# Patient Record
Sex: Male | Born: 1950 | Race: White | Hispanic: No | Marital: Married | Smoking: Former smoker
Health system: Southern US, Community
[De-identification: ages and names within clinical notes are randomized; demographics above are authoritative.]

## PROBLEM LIST (undated history)

## (undated) DIAGNOSIS — K529 Noninfective gastroenteritis and colitis, unspecified: Secondary | ICD-10-CM

## (undated) HISTORY — PX: FINGER SURGERY: SHX640

## (undated) HISTORY — PX: HERNIA REPAIR: SHX51

## (undated) HISTORY — DX: Noninfective gastroenteritis and colitis, unspecified: K52.9

## (undated) HISTORY — PX: APPENDECTOMY: SHX54

---

## 2008-02-06 ENCOUNTER — Ambulatory Visit: Payer: Self-pay | Admitting: Internal Medicine

## 2009-02-04 ENCOUNTER — Ambulatory Visit: Payer: Self-pay | Admitting: Gastroenterology

## 2014-06-16 ENCOUNTER — Ambulatory Visit
Admit: 2014-06-16 | Disposition: A | Discharge status: Home / Self Care | Payer: Self-pay | Attending: Family Medicine | Admitting: Family Medicine

## 2017-02-26 DIAGNOSIS — K200 Eosinophilic esophagitis: Secondary | ICD-10-CM

## 2017-02-26 HISTORY — DX: Eosinophilic esophagitis: K20.0

## 2017-03-04 ENCOUNTER — Other Ambulatory Visit: Payer: Self-pay

## 2017-03-04 ENCOUNTER — Ambulatory Visit
Admission: EM | Admit: 2017-03-04 | Discharge: 2017-03-04 | Disposition: A | Discharge status: Home / Self Care | Payer: Medicare Other | Attending: Family Medicine | Admitting: Family Medicine

## 2017-03-04 ENCOUNTER — Encounter: Payer: Self-pay | Admitting: Emergency Medicine

## 2017-03-04 DIAGNOSIS — Z87891 Personal history of nicotine dependence: Secondary | ICD-10-CM | POA: Insufficient documentation

## 2017-03-04 DIAGNOSIS — N529 Male erectile dysfunction, unspecified: Secondary | ICD-10-CM | POA: Diagnosis not present

## 2017-03-04 DIAGNOSIS — N410 Acute prostatitis: Secondary | ICD-10-CM

## 2017-03-04 DIAGNOSIS — R300 Dysuria: Secondary | ICD-10-CM

## 2017-03-04 DIAGNOSIS — R350 Frequency of micturition: Secondary | ICD-10-CM | POA: Diagnosis present

## 2017-03-04 LAB — CBC WITH DIFFERENTIAL/PLATELET
BASOS ABS: 0.1 10*3/uL (ref 0–0.1)
BASOS PCT: 1 %
EOS ABS: 0.2 10*3/uL (ref 0–0.7)
Eosinophils Relative: 3 %
HEMATOCRIT: 42.5 % (ref 40.0–52.0)
Hemoglobin: 14.9 g/dL (ref 13.0–18.0)
Lymphocytes Relative: 27 %
Lymphs Abs: 2.4 10*3/uL (ref 1.0–3.6)
MCH: 31.3 pg (ref 26.0–34.0)
MCHC: 35 g/dL (ref 32.0–36.0)
MCV: 89.6 fL (ref 80.0–100.0)
MONO ABS: 0.9 10*3/uL (ref 0.2–1.0)
Monocytes Relative: 10 %
NEUTROS ABS: 5.3 10*3/uL (ref 1.4–6.5)
Neutrophils Relative %: 59 %
PLATELETS: 243 10*3/uL (ref 150–440)
RBC: 4.74 MIL/uL (ref 4.40–5.90)
RDW: 13.1 % (ref 11.5–14.5)
WBC: 8.9 10*3/uL (ref 3.8–10.6)

## 2017-03-04 LAB — URINALYSIS, COMPLETE (UACMP) WITH MICROSCOPIC
Bilirubin Urine: NEGATIVE
GLUCOSE, UA: NEGATIVE mg/dL
KETONES UR: NEGATIVE mg/dL
Nitrite: NEGATIVE
PROTEIN: 100 mg/dL — AB
SQUAMOUS EPITHELIAL / LPF: NONE SEEN
Specific Gravity, Urine: 1.025 (ref 1.005–1.030)
pH: 6 (ref 5.0–8.0)

## 2017-03-04 LAB — BASIC METABOLIC PANEL
ANION GAP: 8 (ref 5–15)
BUN: 11 mg/dL (ref 6–20)
CALCIUM: 9.1 mg/dL (ref 8.9–10.3)
CO2: 30 mmol/L (ref 22–32)
Chloride: 99 mmol/L — ABNORMAL LOW (ref 101–111)
Creatinine, Ser: 0.88 mg/dL (ref 0.61–1.24)
Glucose, Bld: 113 mg/dL — ABNORMAL HIGH (ref 65–99)
Potassium: 4.7 mmol/L (ref 3.5–5.1)
Sodium: 137 mmol/L (ref 135–145)

## 2017-03-04 MED ORDER — SULFAMETHOXAZOLE-TRIMETHOPRIM 800-160 MG PO TABS: 1.0000 | ORAL_TABLET | Freq: Two times a day (BID) | ORAL | 0 refills | Status: AC

## 2017-03-04 NOTE — Discharge Instructions (Signed)
Take medication as prescribed. Rest. Drink plenty of fluids.  ° °Follow up with your primary care physician this week. Return to Urgent care for new or worsening concerns.  ° °

## 2017-03-04 NOTE — ED Provider Notes (Signed)
[facility] ____________________________________________  Time seen: Approximately 12:27 PM  I have reviewed the triage vital signs and the nursing notes.   HISTORY  Chief Complaint Dysuria and Urinary Frequency   HPI Alan Meyer is a 67 y.o. male presenting for evaluation of dysuria as well as recent fever.  Patient reports this past Friday and Saturday he subjectively had a fever with chills, warm sensation, body aches.  States Saturday he then noticed some burning with urination as well as urinary hesitancy and dribbling with urination.  States that the urinary hesitancy and dribbling is not constant but comes and goes.  States no current back pain.  Denies associated penile or testicular pain, swelling, redness or changes.  Denies history of prostatitis.  Reports continues to eat and drink well.  Recent cough, congestion, sore throat to explain fever.  Reports otherwise feels well.  Denies history of same.  Denies other aggravating or alleviating factors.  States that [facility] to be evaluated, but was unable to be seen.  States it is been several years since he has had blood work performed.  Denies other complaints.  No over-the-counter medications taken for the same complaint. Denies chest pain, shortness of breath, abdominal pain, or rash. Denies recent sickness. Denies recent antibiotic use.   [facility]   History reviewed. No pertinent past medical history. Erectile dysfunction  There are no active problems to display for this patient.   Past Surgical History:  Procedure Laterality Date  . APPENDECTOMY       No current facility-administered medications for this encounter.   Current Outpatient Medications:  .  sulfamethoxazole-trimethoprim (BACTRIM DS,SEPTRA DS) 800-160 MG tablet, Take 1 tablet by mouth 2 (two) times daily for 14 days., Disp: 28 tablet, Rfl: 0  Allergies Patient has no known  allergies.  family history Denies prostate issues.   Social History Social History   Tobacco Use  . Smoking status: Former Games developermoker  . Smokeless tobacco: Never Used  Substance Use Topics  . Alcohol use: Yes  . Drug use: No    Review of Systems Constitutional: No fever/chills Eyes: No visual changes. ENT: No sore throat. Cardiovascular: Denies chest pain. Respiratory: Denies shortness of breath. Gastrointestinal: No abdominal pain.  No nausea, no vomiting.  No diarrhea.  No constipation. Genitourinary: Positive for dysuria. Musculoskeletal: Negative for back pain. Skin: Negative for rash.   ____________________________________________   PHYSICAL EXAM:  VITAL SIGNS: ED Triage Vitals  Enc Vitals Group     BP 03/04/17 1111 (!) 163/99     Pulse Rate 03/04/17 1111 76     Resp 03/04/17 1111 16     Temp 03/04/17 1111 97.9 F (36.6 C)     Temp Source 03/04/17 1111 Oral     SpO2 03/04/17 1111 100 %     Weight 03/04/17 1108 158 lb (71.7 kg)     Height 03/04/17 1108 5\' 6"  (1.676 m)     Head Circumference --      Peak Flow --      Pain Score 03/04/17 1108 0     Pain Loc --      Pain Edu? --      Excl. in GC? --     Constitutional: Alert and oriented. Well appearing and in no acute distress. Cardiovascular: Normal rate, regular rhythm. Grossly normal heart sounds.  Good peripheral circulation. Respiratory: Normal respiratory effort without tachypnea nor retractions. Breath sounds are clear and equal bilaterally. No  wheezes, rales, rhonchi. Gastrointestinal: Soft and nontender. No CVA tenderness. Prostate: Patient declined chaperone.  Firm warm mildly tender prostate. Musculoskeletal:  No midline cervical, thoracic or lumbar tenderness to palpation. Neurologic:  Normal speech and language.Speech is normal. No gait instability.  Skin:  Skin is warm, dry and intact. No rash noted. Psychiatric: Mood and affect are normal. Speech and behavior are normal. Patient exhibits  appropriate insight and judgment   ___________________________________________   LABS (all labs ordered are listed, but only abnormal results are displayed)  Labs Reviewed  URINALYSIS, COMPLETE (UACMP) WITH MICROSCOPIC - Abnormal; Notable for the following components:      Result Value   APPearance CLOUDY (*)    Hgb urine dipstick TRACE (*)    Protein, ur 100 (*)    Leukocytes, UA SMALL (*)    Bacteria, UA RARE (*)    All other components within normal limits  BASIC METABOLIC PANEL - Abnormal; Notable for the following components:   Chloride 99 (*)    Glucose, Bld 113 (*)    All other components within normal limits  URINE CULTURE  CBC WITH DIFFERENTIAL/PLATELET    PROCEDURES Procedures    INITIAL IMPRESSION / ASSESSMENT AND PLAN / ED COURSE  Pertinent labs & imaging results that were available during my care of the patient were reviewed by me and considered in my medical decision making (see chart for details).  Well-appearing patient.  Laboratory studies overall unremarkable except for urine.  Urine not clear UTI, suspect prostatitis.  Will culture urine.  Will treat patient with 2 weeks of oral Bactrim.  Encourage PCP follow-up.  Discussed strict follow-up and return parameters.Discussed indication, risks and benefits of medications with patient.  Discussed follow up with Primary care physician this week. Discussed follow up and return parameters including no resolution or any worsening concerns. Patient verbalized understanding and agreed to plan.   ____________________________________________   FINAL CLINICAL IMPRESSION(S) / ED DIAGNOSES  Final diagnoses:  Acute prostatitis  Dysuria     ED Discharge Orders        Ordered    sulfamethoxazole-trimethoprim (BACTRIM DS,SEPTRA DS) 800-160 MG tablet  2 times daily     03/04/17 1256       Note: This dictation was prepared with Dragon dictation along with smaller phrase technology. Any transcriptional errors that  result from this process are unintentional.         Renford Dills, NP 03/04/17 1349

## 2017-03-04 NOTE — ED Triage Notes (Signed)
Patient c/o pain when urinating and urinary frequency that started Friday night.  Patient reports some fevers.

## 2017-03-06 LAB — URINE CULTURE: Culture: NO GROWTH

## 2017-03-12 DIAGNOSIS — J3089 Other allergic rhinitis: Secondary | ICD-10-CM | POA: Insufficient documentation

## 2017-03-12 DIAGNOSIS — N529 Male erectile dysfunction, unspecified: Secondary | ICD-10-CM | POA: Insufficient documentation

## 2018-04-11 ENCOUNTER — Other Ambulatory Visit: Payer: Self-pay

## 2018-04-11 ENCOUNTER — Ambulatory Visit
Admission: EM | Admit: 2018-04-11 | Discharge: 2018-04-11 | Disposition: A | Discharge status: Home / Self Care | Payer: Medicare HMO | Attending: Emergency Medicine | Admitting: Emergency Medicine

## 2018-04-11 ENCOUNTER — Ambulatory Visit (INDEPENDENT_AMBULATORY_CARE_PROVIDER_SITE_OTHER): Payer: Medicare HMO

## 2018-04-11 ENCOUNTER — Encounter: Payer: Self-pay | Admitting: Emergency Medicine

## 2018-04-11 DIAGNOSIS — R0602 Shortness of breath: Secondary | ICD-10-CM | POA: Diagnosis not present

## 2018-04-11 DIAGNOSIS — Z87891 Personal history of nicotine dependence: Secondary | ICD-10-CM

## 2018-04-11 DIAGNOSIS — R05 Cough: Secondary | ICD-10-CM | POA: Diagnosis not present

## 2018-04-11 DIAGNOSIS — J111 Influenza due to unidentified influenza virus with other respiratory manifestations: Secondary | ICD-10-CM

## 2018-04-11 DIAGNOSIS — R062 Wheezing: Secondary | ICD-10-CM

## 2018-04-11 DIAGNOSIS — J189 Pneumonia, unspecified organism: Secondary | ICD-10-CM

## 2018-04-11 MED ORDER — AMOXICILLIN-POT CLAVULANATE ER 1000-62.5 MG PO TB12: 2.0000 | ORAL_TABLET | Freq: Two times a day (BID) | ORAL | 0 refills | Status: DC

## 2018-04-11 MED ORDER — ALBUTEROL SULFATE HFA 108 (90 BASE) MCG/ACT IN AERS: 1.0000 | INHALATION_SPRAY | Freq: Four times a day (QID) | RESPIRATORY_TRACT | 0 refills | Status: AC | PRN

## 2018-04-11 MED ORDER — AEROCHAMBER PLUS MISC: 2 refills | Status: AC

## 2018-04-11 MED ORDER — IPRATROPIUM-ALBUTEROL 0.5-2.5 (3) MG/3ML IN SOLN
3.0000 mL | Freq: Once | RESPIRATORY_TRACT | Status: AC
  Administered 2018-04-11: 3 mL via RESPIRATORY_TRACT

## 2018-04-11 MED ORDER — HYDROCOD POLST-CPM POLST ER 10-8 MG/5ML PO SUER: 5.0000 mL | Freq: Two times a day (BID) | ORAL | 0 refills | Status: AC | PRN

## 2018-04-11 MED ORDER — AZITHROMYCIN 250 MG PO TABS: 250.0000 mg | ORAL_TABLET | Freq: Every day | ORAL | 0 refills | Status: DC

## 2018-04-11 MED ORDER — BENZONATATE 200 MG PO CAPS: 200.0000 mg | ORAL_CAPSULE | Freq: Three times a day (TID) | ORAL | 0 refills | Status: DC | PRN

## 2018-04-11 NOTE — ED Triage Notes (Signed)
Patient in today c/o cough, sob and chest tightness x 3 days. Patient has felt feverish, but hasn't taken his temperature.

## 2018-04-11 NOTE — Discharge Instructions (Addendum)
2 puffs from your albuterol inhaler every 4 hours for the next 2 days.  Then you may back off to puffs every 6 hours for the 2 days after that.  Then you may use it as needed.  Finish the Augmentin and the azithromycin, even if you feel better.  Tessalon will help with the cough, Tussionex for the cough only if you cannot sleep at all.  Try 2.5 mL at first.  It has a narcotic in it, which can make you very sleepy and increase your risk of falls.  Please be very careful in using this.

## 2018-04-11 NOTE — ED Provider Notes (Signed)
HPI  SUBJECTIVE:  Alan Meyer is a 68 y.o. male who presents with 3 days of a cough productive of yellowish-greenish, brownish phlegm.  States that he has felt feverish, but does not have a thermometer at home.  He reports wheezing, diffuse chest soreness secondary to cough, shortness of breath and dyspnea on exertion.  No nasal congestion, change in his baseline rhinorrhea, sinus pain or pressure, postnasal drip.  No posttussive emesis.  No lower extremity edema, calf pain, hemoptysis, surgery in the past 4 weeks.  No prolonged immobilization.  No unintentional weight gain, nocturia, PND, orthopnea.  Reports abdominal soreness from a cough, denies other abdominal pain.  No antibiotics in the past 3 months.  No antipyretic in the past 4 to 6 hours.  He has been taking Tamiflu and Tylenol.  The Tylenol helps.  No aggravating factors.  Past medical history positive for 70 year history of smoking, has quit.  No history of emphysema, COPD, CHF, DVT, PE, cancer, pneumonia, diabetes, hypertension.  PMD: Doristine [facility].  Patient had a nurse visit 3 days ago for flulike symptoms, started on Tamiflu.  Called again today due to worsening symptoms, and was advised to come here. History reviewed. No pertinent past medical history.  Past Surgical History:  Procedure Laterality Date  . APPENDECTOMY    . FINGER SURGERY    . HERNIA REPAIR      Family History  Problem Relation Age of Onset  . Dementia Mother   . Heart attack Father 63    Social History   Tobacco Use  . Smoking status: Former Smoker    Last attempt to quit: 2010    Years since quitting: 10.1  . Smokeless tobacco: Never Used  Substance Use Topics  . Alcohol use: Not Currently    Comment: quit 02/28/2018  . Drug use: No    No current facility-administered medications for this encounter.   Current Outpatient Medications:  .  oseltamivir (TAMIFLU) 75 MG capsule, Take by mouth., Disp: , Rfl:  .  sildenafil  (REVATIO) 20 MG tablet, Take 20 mg by mouth daily as needed., Disp: , Rfl:  .  albuterol (PROVENTIL HFA;VENTOLIN HFA) 108 (90 Base) MCG/ACT inhaler, Inhale 1-2 puffs into the lungs every 6 (six) hours as needed for wheezing or shortness of breath., Disp: 1 Inhaler, Rfl: 0 .  amoxicillin-clavulanate (AUGMENTIN XR) 1000-62.5 MG 12 hr tablet, Take 2 tablets by mouth 2 (two) times daily. X 7 days, Disp: 28 tablet, Rfl: 0 .  azithromycin (ZITHROMAX) 250 MG tablet, Take 1 tablet (250 mg total) by mouth daily. 2 tabs po on day 1, 1 tab po on days 2-5, Disp: 6 tablet, Rfl: 0 .  benzonatate (TESSALON) 200 MG capsule, Take 1 capsule (200 mg total) by mouth 3 (three) times daily as needed for cough., Disp: 30 capsule, Rfl: 0 .  chlorpheniramine-HYDROcodone (TUSSIONEX PENNKINETIC ER) 10-8 MG/5ML SUER, Take 5 mLs by mouth every 12 (twelve) hours as needed for cough., Disp: 60 mL, Rfl: 0 .  Spacer/Aero-Holding Chambers (AEROCHAMBER PLUS) inhaler, Use as instructed, Disp: 1 each, Rfl: 2  No Known Allergies   ROS  As noted in HPI.   Physical Exam  BP (!) 153/89 (BP Location: Left Arm)   Pulse (!) 124   Temp 98.8 F (37.1 C) (Oral)   Resp 18   Ht 5' 6.5" (1.689 m)   Wt 73.5 kg   SpO2 95%   BMI 25.76 kg/m   Constitutional: Well  developed, well nourished, no acute distress Eyes:  EOMI, conjunctiva normal bilaterally HENT: Normocephalic, atraumatic,mucus membranes moist.  No nasal congestion.  No sinus tenderness.  No obvious postnasal drip. Respiratory: Normal inspiratory effort fair air movement, diffuse wheezing throughout all lung fields in the left more so than right.  Rales at the bases bilaterally. Cardiovascular: Regular tachycardia, no murmurs, rubs, gallops GI: nondistended skin: No rash, skin intact Musculoskeletal: no deformities calves symmetric, nontender, no edema. Neurologic: Alert & oriented x 3, no focal neuro deficits Psychiatric: Speech and behavior appropriate   ED  Course   Medications  ipratropium-albuterol (DUONEB) 0.5-2.5 (3) MG/3ML nebulizer solution 3 mL (3 mLs Nebulization Given 04/11/18 0950)    Orders Placed This Encounter  Procedures  . DG Chest 2 View    Standing Status:   Standing    Number of Occurrences:   1    Order Specific Question:   Reason for Exam (SYMPTOM  OR DIAGNOSIS REQUIRED)    Answer:   cough fever tachycardia r/o PNA, effusion, pulm edema    No results found for this or any previous visit (from the past 24 hour(s)). Dg Chest 2 View  Result Date: 04/11/2018 CLINICAL DATA:  Productive cough, shortness of breath. EXAM: CHEST - 2 VIEW COMPARISON:  None. FINDINGS: The heart size and mediastinal contours are within normal limits. Both lungs are clear. No pneumothorax or pleural effusion is noted. Atherosclerosis of thoracic aorta is noted. The visualized skeletal structures are unremarkable. IMPRESSION: No active cardiopulmonary disease. Aortic Atherosclerosis (ICD10-I70.0). Electronically Signed   By: Lupita Raider, M.D.   On: 04/11/2018 09:58    ED Clinical Impression  Community acquired pneumonia, unspecified laterality  Influenza   ED Assessment/Plan  Outside records reviewed.  As noted in HPI.  Afebrile, but tachycardic.  His previous O2 saturation was 100%. Checking chest x-ray.  Giving DuoNeb.  Suspect pneumonia or COPD exacerbation from influenza. doubt PE.  [place] Narcotic database reviewed for this patient, and feel that the risk/benefit ratio today is favorable for proceeding with a prescription for controlled substance.  No Opiate prescriptions in the past 2 years.  Reviewed imaging independently. no Pneumonia.  See radiology report for full details.  On reevaluation, patient states that he feels better.  He still has prolonged expiratory phase, wheezing on the left, rales at the bases bilaterally.  Given that the patient is getting worse, has abnormal vital signs, will treat as pneumonia with Augmentin 2000  mg twice daily and azithromycin Z-Pak.  Home With albuterol inhaler with a spacer.  He is to use this on a regular basis for the next 4 days.  Gave patient strict ER return precautions.  Discussed  imaging, MDM, treatment plan, and plan for follow-up with patient. Discussed sn/sx that should prompt return to the ED. patient agrees with plan.   Meds ordered this encounter  Medications  . ipratropium-albuterol (DUONEB) 0.5-2.5 (3) MG/3ML nebulizer solution 3 mL  . albuterol (PROVENTIL HFA;VENTOLIN HFA) 108 (90 Base) MCG/ACT inhaler    Sig: Inhale 1-2 puffs into the lungs every 6 (six) hours as needed for wheezing or shortness of breath.    Dispense:  1 Inhaler    Refill:  0  . Spacer/Aero-Holding Chambers (AEROCHAMBER PLUS) inhaler    Sig: Use as instructed    Dispense:  1 each    Refill:  2  . chlorpheniramine-HYDROcodone (TUSSIONEX PENNKINETIC ER) 10-8 MG/5ML SUER    Sig: Take 5 mLs by mouth every 12 (twelve) hours as  needed for cough.    Dispense:  60 mL    Refill:  0  . benzonatate (TESSALON) 200 MG capsule    Sig: Take 1 capsule (200 mg total) by mouth 3 (three) times daily as needed for cough.    Dispense:  30 capsule    Refill:  0  . azithromycin (ZITHROMAX) 250 MG tablet    Sig: Take 1 tablet (250 mg total) by mouth daily. 2 tabs po on day 1, 1 tab po on days 2-5    Dispense:  6 tablet    Refill:  0  . amoxicillin-clavulanate (AUGMENTIN XR) 1000-62.5 MG 12 hr tablet    Sig: Take 2 tablets by mouth 2 (two) times daily. X 7 days    Dispense:  28 tablet    Refill:  0    *This clinic note was created using Scientist, clinical (histocompatibility and immunogenetics)Dragon dictation software. Therefore, there may be occasional mistakes despite careful proofreading.   ?   Domenick GongMortenson, Alajah Witman, MD 04/11/18 1114

## 2018-04-16 ENCOUNTER — Other Ambulatory Visit: Payer: Self-pay

## 2018-04-16 ENCOUNTER — Ambulatory Visit (INDEPENDENT_AMBULATORY_CARE_PROVIDER_SITE_OTHER): Payer: Medicare HMO

## 2018-04-16 ENCOUNTER — Encounter: Payer: Self-pay | Admitting: Emergency Medicine

## 2018-04-16 ENCOUNTER — Ambulatory Visit
Admission: EM | Admit: 2018-04-16 | Discharge: 2018-04-16 | Disposition: A | Discharge status: Home / Self Care | Payer: Medicare HMO | Attending: Family Medicine | Admitting: Family Medicine

## 2018-04-16 DIAGNOSIS — S60222A Contusion of left hand, initial encounter: Secondary | ICD-10-CM

## 2018-04-16 DIAGNOSIS — W541XXA Struck by dog, initial encounter: Secondary | ICD-10-CM

## 2018-04-16 DIAGNOSIS — R059 Cough, unspecified: Secondary | ICD-10-CM

## 2018-04-16 DIAGNOSIS — R05 Cough: Secondary | ICD-10-CM | POA: Diagnosis not present

## 2018-04-16 MED ORDER — PREDNISONE 20 MG PO TABS: ORAL_TABLET | ORAL | 0 refills | Status: DC
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
## 2018-04-16 NOTE — ED Triage Notes (Signed)
Patient returns today for re evaluation of visit on 02/14. He states he was seen and treated for pneumonia but the cough has continued.  Patient also c/o left hand pain that started yesterday. Denies injury.

## 2018-04-16 NOTE — Discharge Instructions (Signed)
Rest, ice, elevation °
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
## 2021-07-12 DIAGNOSIS — K529 Noninfective gastroenteritis and colitis, unspecified: Secondary | ICD-10-CM | POA: Insufficient documentation

## 2021-12-27 DIAGNOSIS — I10 Essential (primary) hypertension: Secondary | ICD-10-CM | POA: Insufficient documentation

## 2022-01-26 DIAGNOSIS — R002 Palpitations: Secondary | ICD-10-CM | POA: Insufficient documentation

## 2022-01-26 DIAGNOSIS — R079 Chest pain, unspecified: Secondary | ICD-10-CM | POA: Insufficient documentation

## 2022-01-26 DIAGNOSIS — R0602 Shortness of breath: Secondary | ICD-10-CM | POA: Insufficient documentation

## 2022-02-28 DIAGNOSIS — I491 Atrial premature depolarization: Secondary | ICD-10-CM | POA: Insufficient documentation

## 2023-01-11 ENCOUNTER — Other Ambulatory Visit: Payer: Self-pay

## 2023-01-14 NOTE — Progress Notes (Signed)
Alan Meyer [address]  [address]  [place], [place] [address]  Main: [phone]  Fax: [phone]   Gastroenterology Consultation  Referring Provider:     Judeth Meyer* Primary Care Physician:  Alan Meyer Primary Gastroenterologist:  Alan Meyer / Dr. Wyline Meyer   Reason for Consultation:     Chronic diarrhea, Dysphagia, EOE        HPI:   Alan Meyer is a 72 y.o. y/o male referred for consultation & management  by Alan Meyer.    Previous patient of [facility] Alan Meyer.  Has history of chronic intermittent loose stools for many years.  Patient has moderate dementia.  He is here today with his wife, Alan Meyer, who is helping provide his history.  Patient has chronic diarrhea for many years.  2-5 loose stools daily.  He is taking loperamide 3 times daily.  Denies abdominal pain, rectal bleeding.  Weight is up and down.    Patient has also had recurrent solid food dysphagia feeling like food is getting stuck in his throat and chest.  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  No current treatment for EOE.  He is not on PPI.  01/02/2023: Labs: Normal CBC (hemoglobin 14.4).  Normal CMP except total bilirubin 1.8.  Normal vitamin B12, vitamin D, and folate.  GFR 80.  Normal TSH.  2021: Negative C. difficile PCR, negative Giardia and Cryptosporidium.  10/2018: Screening colonoscopy: Dr. Marva Meyer: Result not available.   EGD 10/21/2017: Eosinophilic Esophagitis; No Repeat.  COLONOSCOPY 02/04/2009:  Dr. Marva Meyer @ [facility] - Diverticulosis, 10 yr repeat   Past Medical History:  Diagnosis Date   Chest pain with high risk for cardiac etiology 01/26/2022   Chronic diarrhea of unknown origin 07/12/2021   Erectile dysfunction 03/12/2017   Essential hypertension 12/27/2021   Heart palpitations 01/26/2022   Non-seasonal allergic rhinitis 03/12/2017   Premature atrial contractions 02/28/2022   SOB (shortness  of breath) on exertion 01/26/2022    Past Surgical History:  Procedure Laterality Date   APPENDECTOMY     FINGER SURGERY     HERNIA REPAIR      Prior to Admission medications   Medication Sig Start Date End Date Taking? Authorizing Provider  albuterol (PROVENTIL HFA;VENTOLIN HFA) 108 (90 Base) MCG/ACT inhaler Inhale 1-2 puffs into the lungs every 6 (six) hours as needed for wheezing or shortness of breath. 04/11/18  Yes Alan Gong, Meyer  chlorpheniramine-HYDROcodone [facility]) 10-8 MG/5ML SUER Take 5 mLs by mouth every 12 (twelve) hours as needed for cough. 04/11/18  Yes Alan Gong, Meyer  Cholecalciferol (VITAMIN D-3 PO) Take by mouth.   Yes Provider, Historical, Meyer  cholestyramine (QUESTRAN) 4 g packet Take 1 packet (4 g total) by mouth daily. 01/15/23 07/14/23 Yes Alan Meyer  cyanocobalamin (VITAMIN B12) 1000 MCG tablet Take by mouth.   Yes Provider, Historical, Meyer  dicyclomine (BENTYL) 10 MG capsule TAKE 1 CAPSULE BY MOUTH THREE TIMES DAILY AS NEEDED BEFORE MEAL(S) FOR  UP  TO  90  DOSES 06/22/21  Yes Provider, Historical, Meyer  donepezil (ARICEPT) 10 MG tablet Take 1 tablet by mouth at bedtime. 09/24/22  Yes Provider, Historical, Meyer  escitalopram (LEXAPRO) 5 MG tablet Take 1 tablet by mouth daily. 10/09/22  Yes Provider, Historical, Meyer  lisinopril (ZESTRIL) 10 MG tablet Take 1 tablet by mouth daily. 12/27/21  Yes Provider, Historical, Meyer  loperamide (IMODIUM) 2 MG capsule Take by mouth.  11/08/22  Yes Provider, Historical, Meyer  memantine (NAMENDA) 5 MG tablet Take 1 tablet by mouth at bedtime. 01/02/23 01/02/24 Yes Provider, Historical, Meyer  Multiple Vitamin (MULTI-VITAMIN) tablet Take 1 tablet by mouth daily.   Yes Provider, Historical, Meyer  pantoprazole (PROTONIX) 40 MG tablet Take 1 tablet (40 mg total) by mouth daily. 01/15/23 04/15/23 Yes Alan Meyer  rosuvastatin (CRESTOR) 5 MG tablet Take by mouth. 04/24/22  Yes Provider, Historical, Meyer  sildenafil  (REVATIO) 20 MG tablet Take 20 mg by mouth daily as needed. 04/03/18  Yes Provider, Historical, Meyer  Spacer/Aero-Holding Chambers (AEROCHAMBER PLUS) inhaler Use as instructed 04/11/18  Yes Alan Gong, Meyer  zinc gluconate 3.75 mg/mL SOLN Take by mouth.   Yes Provider, Historical, Meyer     Family History  Problem Relation Age of Onset   Dementia Mother    Heart attack Father 64     Social History   Tobacco Use   Smoking status: Former    Current packs/day: 0.00    Types: Cigarettes    Quit date: 2010    Years since quitting: 14.8   Smokeless tobacco: Never  Vaping Use   Vaping status: Never Used  Substance Use Topics   Alcohol use: Not Currently    Comment: quit 02/28/2018   Drug use: No    Allergies as of 01/15/2023   (No Known Allergies)    Review of Systems:    All systems reviewed and negative except where noted in HPI.   Physical Exam:  BP 130/77   Pulse 65   Temp 97.6 F (36.4 C)   Ht 5\' 7"  (1.702 m)   Wt 149 lb 9.6 oz (67.9 kg)   BMI 23.43 kg/m  No LMP for male patient.  General:   Alert,  Well-developed, well-nourished, pleasant and cooperative in NAD Lungs:  Respirations even and unlabored.  Clear throughout to auscultation.   No wheezes, crackles, or rhonchi. No acute distress. Heart:  Regular rate and rhythm; no murmurs, clicks, rubs, or gallops. Abdomen:  Normal bowel sounds.  No bruits.  Soft, and non-distended without masses, hepatosplenomegaly or hernias noted.  No Tenderness.  No guarding or rebound tenderness.    Neurologic:  Alert and oriented x3;  grossly normal neurologically. Psych:  Alert and cooperative. Normal Meyer and affect.  Imaging Studies: No results found.  Assessment and Plan:   Alan Meyer is a 72 y.o. y/o male has been referred for   1.  Chronic diarrhea; Ddx: IBS-D, Adverse side effects of Meds; Microscopic colitis Rx cholestyramine powder 1 packet daily  Request last Colonoscopy report for Alan Meyer  Continue Imodium  prn.  2.  Eosinophilic esophagitis with Recurrent Dysphagia  Rx Pantoprazole 40mg  1 tablet daily.  If no improvement, then increase pantoprazole to 40 mg twice daily.  If no improvement then try budesonide.  If no improvement then Dupixent.  Follow up 4 weeks to assess response to treatment  Alan Meyer

## 2023-01-15 ENCOUNTER — Encounter: Payer: Self-pay | Admitting: Physician Assistant

## 2023-01-15 ENCOUNTER — Ambulatory Visit: Payer: Medicare Other | Admitting: Physician Assistant

## 2023-01-15 VITALS — BP 130/77 | HR 65 | Temp 97.6°F | Ht 67.0 in | Wt 149.6 lb

## 2023-01-15 DIAGNOSIS — K529 Noninfective gastroenteritis and colitis, unspecified: Secondary | ICD-10-CM | POA: Diagnosis not present

## 2023-01-15 DIAGNOSIS — K200 Eosinophilic esophagitis: Secondary | ICD-10-CM

## 2023-01-15 MED ORDER — PANTOPRAZOLE SODIUM 40 MG PO TBEC: 40.0000 mg | DELAYED_RELEASE_TABLET | Freq: Every day | ORAL | 2 refills | Status: DC

## 2023-01-15 MED ORDER — CHOLESTYRAMINE 4 G PO PACK: 4.0000 g | PACK | Freq: Every day | ORAL | 5 refills | Status: AC

## 2023-02-25 NOTE — Progress Notes (Signed)
 Ellouise Console, PA-C [address]  [address]  [place], [place] [address]  Main: [phone]  Fax: [phone]   Primary Care Physician: Perri Constance Sor, PA-C  Primary Gastroenterologist:  Ellouise Console, PA-C   CC:  F/U Chronic diarrhea, Dysphagia, EOE   HPI: Alan Meyer is a 72 y.o. male returns for 6-week follow-up of chronic diarrhea, dysphagia, EOE.  6 weeks ago he was started on pantoprazole  40 Mg once daily to treat EOE.  Also started cholestyramine  powder, 1 packet in a drink once daily to treat chronic diarrhea.  Continue Imodium as needed.  Almost treatment his GI symptoms have greatly improved.  He is not having any more dysphagia or diarrhea symptoms.  Currently having formed bowel movement daily.  He denies constipation, hard stools, or diarrhea.  He is able to go out and do normal activities.  Quality of life has improved.  His wife reports he is feeling a lot better.  No concerns today.  No adverse side effect of medication.  Previous patient of [facility].  Has history of chronic intermittent loose stools for many years.  Patient has moderate dementia.  He is here today with his wife, Alan Meyer, who is helping provide his history.     01/02/2023: Labs: Normal CBC (hemoglobin 14.4).  Normal CMP except total bilirubin 1.8.  Normal vitamin B12, vitamin D, and folate.  GFR 80.  Normal TSH.   2021: Negative C. difficile PCR, negative Giardia and Cryptosporidium.   10/2018: Screening colonoscopy: Dr. Gaylyn: Result not available.    EGD 10/21/2017: Eosinophilic Esophagitis; No Repeat.  COLONOSCOPY 02/04/2009:  Dr. Gaylyn @ [facility] - Diverticulosis, 10 yr repeat  Current Outpatient Medications  Medication Sig Dispense Refill   albuterol  (PROVENTIL  HFA;VENTOLIN  HFA) 108 (90 Base) MCG/ACT inhaler Inhale 1-2 puffs into the lungs every 6 (six) hours as needed for wheezing or shortness of breath. 1 Inhaler 0   chlorpheniramine-HYDROcodone (TUSSIONEX  PENNKINETIC ER) 10-8 MG/5ML SUER Take 5 mLs by mouth every 12 (twelve) hours as needed for cough. 60 mL 0   Cholecalciferol (VITAMIN D-3 PO) Take by mouth.     cholestyramine  (QUESTRAN ) 4 g packet Take 1 packet (4 g total) by mouth daily. 30 packet 5   cyanocobalamin (VITAMIN B12) 1000 MCG tablet Take by mouth.     dicyclomine (BENTYL) 10 MG capsule TAKE 1 CAPSULE BY MOUTH THREE TIMES DAILY AS NEEDED BEFORE MEAL(S) FOR  UP  TO  90  DOSES     donepezil (ARICEPT) 10 MG tablet Take 1 tablet by mouth at bedtime.     escitalopram (LEXAPRO) 5 MG tablet Take 1 tablet by mouth daily.     lisinopril (ZESTRIL) 10 MG tablet Take 1 tablet by mouth daily.     loperamide (IMODIUM) 2 MG capsule Take by mouth.     memantine (NAMENDA) 5 MG tablet Take 1 tablet by mouth at bedtime.     Multiple Vitamin (MULTI-VITAMIN) tablet Take 1 tablet by mouth daily.     pantoprazole  (PROTONIX ) 40 MG tablet Take 1 tablet (40 mg total) by mouth daily. 30 tablet 2   rosuvastatin (CRESTOR) 5 MG tablet Take by mouth.     sildenafil (REVATIO) 20 MG tablet Take 20 mg by mouth daily as needed.     Spacer/Aero-Holding Chambers (AEROCHAMBER PLUS) inhaler Use as instructed 1 each 2   zinc gluconate 3.75 mg/mL SOLN Take by mouth.     No current facility-administered medications for this visit.  Allergies as of 02/26/2023   (No Known Allergies)    Past Medical History:  Diagnosis Date   Chest pain with high risk for cardiac etiology 01/26/2022   Chronic diarrhea    Chronic diarrhea of unknown origin 07/12/2021   Eosinophilic esophagitis 2019   Erectile dysfunction 03/12/2017   Essential hypertension 12/27/2021   Heart palpitations 01/26/2022   Non-seasonal allergic rhinitis 03/12/2017   Premature atrial contractions 02/28/2022   SOB (shortness of breath) on exertion 01/26/2022    Past Surgical History:  Procedure Laterality Date   APPENDECTOMY     FINGER SURGERY     HERNIA REPAIR      Review of Systems:    All  systems reviewed and negative except where noted in HPI.   Physical Examination:   BP 134/60   Pulse (!) 59   Temp 98.1 F (36.7 C)   Ht 5' 7 (1.702 m)   Wt 149 lb 6.4 oz (67.8 kg)   BMI 23.40 kg/m   General: Well-nourished, well-developed in no acute distress.  Neuro: Alert and oriented x 3.  Grossly intact.  Psych: Alert and cooperative, normal mood and affect.   Imaging Studies: No results found.  Assessment and Plan:   Alan Meyer is a 72 y.o. y/o male returns for follow-up of chronic diarrhea and eosinophilic esophagitis.  Was started on cholestyramine  powder 1 packet daily and pantoprazole  40 Mg once daily 6 weeks ago.  Symptoms have greatly improved on this treatment.  1.  Chronic diarrhea; improved and controlled on cholestyramine   Continue cholestyramine  powder, 1 packet in a drink once daily  Continue Imodium as needed  2.  Eosinophilic esophagitis; dysphagia has resolved on pantoprazole .  Continue pantoprazole  40 Mg daily.  If he has recurrent dysphagia in the future, then increased to twice daily PPI, then try budesonide, then consider Dupixent if needed.  Ellouise Console, PA-C  Follow up in 6 months or sooner if recurrent GI symptoms.

## 2023-02-26 ENCOUNTER — Encounter: Payer: Self-pay | Admitting: Physician Assistant

## 2023-02-26 ENCOUNTER — Ambulatory Visit: Payer: Medicare Other | Admitting: Physician Assistant

## 2023-02-26 VITALS — BP 134/60 | HR 59 | Temp 98.1°F | Ht 67.0 in | Wt 149.4 lb

## 2023-02-26 DIAGNOSIS — K200 Eosinophilic esophagitis: Secondary | ICD-10-CM | POA: Diagnosis not present

## 2023-02-26 DIAGNOSIS — K529 Noninfective gastroenteritis and colitis, unspecified: Secondary | ICD-10-CM | POA: Diagnosis not present

## 2023-04-11 ENCOUNTER — Other Ambulatory Visit: Payer: Self-pay | Admitting: Physician Assistant

## 2023-04-11 DIAGNOSIS — K200 Eosinophilic esophagitis: Secondary | ICD-10-CM

## 2023-04-24 ENCOUNTER — Ambulatory Visit: Payer: Medicare Other | Attending: Neurology | Admitting: Speech Pathology

## 2023-04-24 DIAGNOSIS — R41841 Cognitive communication deficit: Secondary | ICD-10-CM | POA: Diagnosis present
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
## 2023-04-29 ENCOUNTER — Ambulatory Visit: Payer: Medicare Other | Attending: Neurology | Admitting: Speech Pathology

## 2023-04-29 DIAGNOSIS — R41841 Cognitive communication deficit: Secondary | ICD-10-CM | POA: Diagnosis present

## 2023-04-29 NOTE — Therapy (Unsigned)
 OUTPATIENT SPEECH LANGUAGE PATHOLOGY  COGNITION TREATMENT SESSION   Patient Name: Alan Meyer MRN: [id number] DOB:1950/04/30, 73 y.o., male Today's Date: 04/29/2023  PCP: Gardiner Coins, [place] REFERRING PROVIDER: Cristopher Peru, MD   End of Session - 04/29/23 0849     Visit Number 2    Number of Visits 17    Date for SLP Re-Evaluation 06/19/23    Authorization Type [employer] [facility] Medicare    Progress Note Due on Visit 10    SLP Start Time 0845    SLP Stop Time  0930    SLP Time Calculation (min) 45 min    Activity Tolerance Patient tolerated treatment well             Past Medical History:  Diagnosis Date   Chest pain with high risk for cardiac etiology 01/26/2022   Chronic diarrhea    Chronic diarrhea of unknown origin 07/12/2021   Eosinophilic esophagitis 2019   Erectile dysfunction 03/12/2017   Essential hypertension 12/27/2021   Heart palpitations 01/26/2022   Non-seasonal allergic rhinitis 03/12/2017   Premature atrial contractions 02/28/2022   SOB (shortness of breath) on exertion 01/26/2022   Past Surgical History:  Procedure Laterality Date   APPENDECTOMY     FINGER SURGERY     HERNIA REPAIR     There are no active problems to display for this patient.   ONSET DATE: January 2023; date of initial referral 01/03/2023 (services not sought at that time); date of current referral 04/17/2023  REFERRING DIAG: G30.9,F01.50,F02.80 (ICD-10-CM) - Mixed Alzheimer and vascular dementia (HCC)  THERAPY DIAG:  Cognitive communication deficit  Rationale for Evaluation and Treatment Rehabilitation  SUBJECTIVE:   PERTINENT HISTORY and DIAGNOSTIC FINDINGS: Pt is a 73 year old male with chart review documenting "Mild to moderate mixed dementia (vascular dementia + Alzheimer's Disease vs frontotemporal dementia - personality changes, some apathy, social disinhibition, with some signs of Lewy Body Dementia - visual hallucinations, some paranoia), onset  January 2023, in patient with difficulty processing information, anxiety in unfamiliar situations, difficulty learning new information (learning how to use a cell phone), confusion with directions while driving, white matter microvascular ischemic and metabolic changes, generalized cerebral atrophy, signs of left basal ganglia region microhemorrhage.APO Alzheimer's Risk - [facility] E3/E4, 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 No   FALLS: Has patient fallen in last 6 months?  No  LIVING ENVIRONMENT: Lives with: lives with their spouse Lives in: House/apartment  PLOF:  Level of assistance: Needed assistance with ADLs, Needed assistance with IADLS Employment: Retired   PATIENT GOALS   to improve language and cognition  SUBJECTIVE STATEMENT: Pt pleasant, intermittent off-topic comments Pt accompanied by: significant other  OBJECTIVE:    TODAY'S TREATMENT:   Skilled treatment session focused on pt's cognitive communication goals. SLP facilitated session by administering the below PROM.  PATIENT REPORTED OUTCOME MEASURES (PROM): Quick Dementia Rating System (QDRS)  The QDRS is a rapid dementia staging tool that provides a brief but valid and reliable assessment of whether a problem is present, and if present how severe it is. The QDRS has 10 categories, each with 5 options that characterize changes in the patient's cognitive and functional abilities. The QDRS is scored on a continuous scale with a range of 0-30. Higher scores suggest more impairment. The QDRS contains two subscales that are designed to see whether cognitive (questions 1, 2, 3 and  8) or behavioral (questions 4, 5, 6, 7, 9, 10) symptoms are the predominant features. Scores in the impaired range indicate a need for further assessment to establish a formal diagnosis. Scores in the "normal" range suggest that a  dementing disorder is unlikely, but a very early disease process cannot be ruled out. Ore advanced assessment may be warranted in cases where other objective evidence of impairment exists.  QDRS scores differentiate with the following cut-points:               Normal                                     0-1             Mild Cognitive Impairment      2-5             Mild Dementia                         6-12             Moderate Dementia                13-20             Severe Dementia                    20-30  The following descriptions characterize changes in the patient's cognitive and functional abilities. You are asked to compare the patient now to how they used to be-they key feature is change. Choose one answer each memory category that best fits the patient.   Memory and Recall  1-Mild to moderate memory loss; more noticeable for recent events; interferes with performing everyday.  2. Orientation 1-Mild to moderate difficulty keeping track of time and sequence of events; forgets month or year; oriented to familiar places but gets confused outside of familiar areas; gets lost or wanders.  3.Decision Making and Problem Solving Abilities 1-Moderate difficulty with handling problems and making decisions; defers many decisions to others; social judgment and behavior may be slightly impaired; loss of insight.  4. Activities Outside the Home  2-No pretense of independent function outside the house; appears well enough to be taken to activities outside the family home but generally needs to be accompanied.  5. Function at Home and [place] Activities 2-Only simple chores preserved, very restricted interest in hobbies which are poorly maintained.  6. Toileting and Personal Hygiene  0.5-Slight changes in abilities and attention to these activities.  7. Behavior and Personality Changes  1-Mild changes in behavior or personality.  8. Language and Communication Abilities  1-Moderate word finding  difficulty in speech, cannot name objects, marked reduction in word production; reduced comprehension, conversation, writing and/or reading.  9. Mood  0.5-Occasional sadness, depression, anxiety, nervousness or loss of interest/motivation.  10. Attention and Concentration  0.5-Mild problems with attention, concentration, and interaction with environment and surroundings, may appear drowsy during day.  Cognitive Subtotal (Questions 1, 2, 3, 8): 4 Behavioral Subtotal (Questions 4, 5, 6, 7, 9, 10): 6.5  Total QDRS Score: 10.5 (normal 0-1, mild cognitive impairment 2-5, mild dementia 6-12, moderate dementia 13-20, severe dementia 20-30).    SLP further facilitated the session by providing instruction in "creating a place for everything and everything has a place." Recommend establishing drawers within the kitchen and bedroom as well as labeling them  to increase functional independence and "errorless" learning. SLP further introduced creating a "to do" list of activities as well as provide pt with an opportunity to interact with orientation information as his wife states that while pt looks at a calendar he has difficulty understand time relationships. SLP provided demonstration with pt, pt able to recall orientation information after brief delay. His wife voiced understanding of how to facilitate increased recall.   PATIENT EDUCATION: Education details: ST plan of care Person educated: Patient and Spouse Education method: Explanation Education comprehension: needs further education   HOME EXERCISE PROGRAM:   N/A   GOALS:  Goals reviewed with patient? Yes  SHORT TERM GOALS: Target date: 10 sessions  With Supervision A, patient will name common household objects at 90% accuracy.          Baseline: Goal status: INITIAL  2.  With Supervision A, patient will name simple line drawings at 75% accuracy. Baseline:  Goal status: INITIAL   LONG TERM GOALS: Target date: 06/19/2023  With Min  A, patient will demonstrate knowledge of appropriate activities to support cognitive and  language function outside of ST with assistance from family.   Baseline:  Goal status: INITIAL  2.  Patient will report engagement in cognitive activities outside of ST for 5/7 days.  Baseline:  Goal status: INITIAL  ASSESSMENT:  CLINICAL IMPRESSION: Patient is a 73 y.o. male who was seen today for a cognitive communication treatment d/t dx of moderate Mixed Alzheimer and Vascular Dementia. Pt with significant cognitive impairment with deficits in memory impacting orientation, recall of information (short-term and working memory), word finding and fluency of communication. He is reliant on his wife for medication management, driving, bill paying, organizing doctor appointments. Pt and his wife report decreased participation in activities, main activity is watching TV. Pt also observed with BUE tremors and whole body movements.   Pt and his wife eager to implement the above recommendations. See the above treatment note for details.    OBJECTIVE IMPAIRMENTS include attention, memory, executive functioning, and expressive language. These impairments are limiting patient from managing medications, managing appointments, managing finances, household responsibilities, ADLs/IADLs, and effectively communicating at home and in community. Factors affecting potential to achieve goals and functional outcome are ability to learn/carryover information, co-morbidities, medical prognosis, previous level of function, and severity of impairments. Patient will benefit from skilled SLP services to address above impairments and improve overall function.  REHAB POTENTIAL: Good  PLAN: SLP FREQUENCY: 1-2x/week  SLP DURATION: 8 weeks  PLANNED INTERVENTIONS: Environmental controls, Cueing hierachy, Cognitive reorganization, Internal/external aids, Functional tasks, SLP instruction and feedback, Compensatory strategies, and  Patient/family education   Tyron Manetta B. Dreama Saa, M.S., CCC-SLP, Tree surgeon Certified Brain Injury Specialist [facility]  [facility] Rehabilitation Services Office [phone] Ascom [phone] Fax [phone]

## 2023-05-01 ENCOUNTER — Ambulatory Visit: Payer: Medicare Other | Admitting: Speech Pathology

## 2023-05-01 DIAGNOSIS — R41841 Cognitive communication deficit: Secondary | ICD-10-CM

## 2023-05-01 NOTE — Therapy (Signed)
 OUTPATIENT SPEECH LANGUAGE PATHOLOGY  COGNITION TREATMENT SESSION   Patient Name: Alan Meyer MRN: [id number] DOB:04-Jun-1950, 73 y.o., male Today's Date: 05/01/2023  PCP: Gardiner Coins, [place] REFERRING PROVIDER: Cristopher Peru, MD   End of Session - 05/01/23 1016     Visit Number 3    Number of Visits 17    Date for SLP Re-Evaluation 06/19/23    Authorization Type [employer] Medicare    Progress Note Due on Visit 10    SLP Start Time 1015    SLP Stop Time  1100    SLP Time Calculation (min) 45 min    Activity Tolerance Patient tolerated treatment well             Past Medical History:  Diagnosis Date   Chest pain with high risk for cardiac etiology 01/26/2022   Chronic diarrhea    Chronic diarrhea of unknown origin 07/12/2021   Eosinophilic esophagitis 2019   Erectile dysfunction 03/12/2017   Essential hypertension 12/27/2021   Heart palpitations 01/26/2022   Non-seasonal allergic rhinitis 03/12/2017   Premature atrial contractions 02/28/2022   SOB (shortness of breath) on exertion 01/26/2022   Past Surgical History:  Procedure Laterality Date   APPENDECTOMY     FINGER SURGERY     HERNIA REPAIR     There are no active problems to display for this patient.   ONSET DATE: January 2023; date of initial referral 01/03/2023 (services not sought at that time); date of current referral 04/17/2023  REFERRING DIAG: G30.9,F01.50,F02.80 (ICD-10-CM) - Mixed Alzheimer and vascular dementia (HCC)  THERAPY DIAG:  Cognitive communication deficit  Rationale for Evaluation and Treatment Rehabilitation  SUBJECTIVE:   PERTINENT HISTORY and DIAGNOSTIC FINDINGS: Pt is a 73 year old male with chart review documenting "Mild to moderate mixed dementia (vascular dementia + Alzheimer's Disease vs frontotemporal dementia - personality changes, some apathy, social disinhibition, with some signs of Lewy Body Dementia - visual hallucinations, some paranoia), onset  January 2023, in patient with difficulty processing information, anxiety in unfamiliar situations, difficulty learning new information (learning how to use a cell phone), confusion with directions while driving, white matter microvascular ischemic and metabolic changes, generalized cerebral atrophy, signs of left basal ganglia region microhemorrhage.APO Alzheimer's Risk - [facility] E3/E4, 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 No   FALLS: Has patient fallen in last 6 months?  No  LIVING ENVIRONMENT: Lives with: lives with their spouse Lives in: House/apartment  PLOF:  Level of assistance: Needed assistance with ADLs, Needed assistance with IADLS Employment: Retired   PATIENT GOALS   to improve language and cognition  SUBJECTIVE STATEMENT: Pt pleasant, brought in his notebook, intermittent off-topic comments Pt accompanied by: significant other  OBJECTIVE:    TODAY'S TREATMENT:   Skilled treatment session focused on pt's cognitive communication goals. SLP facilitated session by providing the following intervention:  Pt brought in his notebook that was completed by him with list of general household activities that the had completed. His wife reported that the notebook "Made him stop and think about what he was doing," Pt also stated, "I didn't realize that I was sitting as much."  SLP further provided printed list of compensatory memory strategies and reviewed with pt and his wife. Both voiced ways int which they could implement the strategies at home.      PATIENT EDUCATION: Education details: ST plan of care Person  educated: Patient and Spouse Education method: Explanation Education comprehension: needs further education   HOME EXERCISE PROGRAM:   N/A   GOALS:  Goals reviewed with patient? Yes  SHORT TERM GOALS: Target date: 10 sessions  With  Supervision A, patient will name common household objects at 90% accuracy.          Baseline: Goal status: INITIAL  2.  With Supervision A, patient will name simple line drawings at 75% accuracy. Baseline:  Goal status: INITIAL   LONG TERM GOALS: Target date: 06/19/2023  With Min A, patient will demonstrate knowledge of appropriate activities to support cognitive and  language function outside of ST with assistance from family.   Baseline:  Goal status: INITIAL  2.  Patient will report engagement in cognitive activities outside of ST for 5/7 days.  Baseline:  Goal status: INITIAL  ASSESSMENT:  CLINICAL IMPRESSION: Patient is a 73 y.o. male who was seen today for a cognitive communication treatment d/t dx of moderate Mixed Alzheimer and Vascular Dementia. Pt with significant cognitive impairment with deficits in memory impacting orientation, recall of information (short-term and working memory), word finding and fluency of communication. He is reliant on his wife for medication management, driving, bill paying, organizing doctor appointments. Pt and his wife report decreased participation in activities, main activity is watching TV. Pt also observed with BUE tremors and whole body movements.   Pt and his wife eager to implement the above recommendations. See the above treatment note for details.    OBJECTIVE IMPAIRMENTS include attention, memory, executive functioning, and expressive language. These impairments are limiting patient from managing medications, managing appointments, managing finances, household responsibilities, ADLs/IADLs, and effectively communicating at home and in community. Factors affecting potential to achieve goals and functional outcome are ability to learn/carryover information, co-morbidities, medical prognosis, previous level of function, and severity of impairments. Patient will benefit from skilled SLP services to address above impairments and improve overall  function.  REHAB POTENTIAL: Good  PLAN: SLP FREQUENCY: 1-2x/week  SLP DURATION: 8 weeks  PLANNED INTERVENTIONS: Environmental controls, Cueing hierachy, Cognitive reorganization, Internal/external aids, Functional tasks, SLP instruction and feedback, Compensatory strategies, and Patient/family education   Patrizia Paule B. Dreama Saa, M.S., CCC-SLP, Tree surgeon Certified Brain Injury Specialist [facility]  [facility] Rehabilitation Services Office [phone] Ascom [phone] Fax [phone]

## 2023-05-06 ENCOUNTER — Ambulatory Visit: Payer: Medicare Other | Admitting: Speech Pathology

## 2023-05-06 DIAGNOSIS — R41841 Cognitive communication deficit: Secondary | ICD-10-CM

## 2023-05-06 NOTE — Therapy (Signed)
 OUTPATIENT SPEECH LANGUAGE PATHOLOGY  COGNITION TREATMENT SESSION   Patient Name: Alan Meyer MRN: [id number] DOB:09/02/1950, 72 y.o., male Today's Date: 05/06/2023  PCP: Gardiner Coins, [place] REFERRING PROVIDER: Cristopher Peru, MD   End of Session - 05/06/23 1134     Visit Number 4    Number of Visits 17    Date for SLP Re-Evaluation 06/19/23    Authorization Type [employer] Medicare    Progress Note Due on Visit 10    SLP Start Time 1015    SLP Stop Time  1055    SLP Time Calculation (min) 40 min    Activity Tolerance Patient tolerated treatment well             Past Medical History:  Diagnosis Date   Chest pain with high risk for cardiac etiology 01/26/2022   Chronic diarrhea    Chronic diarrhea of unknown origin 07/12/2021   Eosinophilic esophagitis 2019   Erectile dysfunction 03/12/2017   Essential hypertension 12/27/2021   Heart palpitations 01/26/2022   Non-seasonal allergic rhinitis 03/12/2017   Premature atrial contractions 02/28/2022   SOB (shortness of breath) on exertion 01/26/2022   Past Surgical History:  Procedure Laterality Date   APPENDECTOMY     FINGER SURGERY     HERNIA REPAIR     There are no active problems to display for this patient.   ONSET DATE: January 2023; date of initial referral 01/03/2023 (services not sought at that time); date of current referral 04/17/2023  REFERRING DIAG: G30.9,F01.50,F02.80 (ICD-10-CM) - Mixed Alzheimer and vascular dementia (HCC)  THERAPY DIAG:  Cognitive communication deficit  Rationale for Evaluation and Treatment Rehabilitation  SUBJECTIVE:   PERTINENT HISTORY and DIAGNOSTIC FINDINGS: Pt is a 73 year old male with chart review documenting "Mild to moderate mixed dementia (vascular dementia + Alzheimer's Disease vs frontotemporal dementia - personality changes, some apathy, social disinhibition, with some signs of Lewy Body Dementia - visual hallucinations, some paranoia), onset  January 2023, in patient with difficulty processing information, anxiety in unfamiliar situations, difficulty learning new information (learning how to use a cell phone), confusion with directions while driving, white matter microvascular ischemic and metabolic changes, generalized cerebral atrophy, signs of left basal ganglia region microhemorrhage.APO Alzheimer's Risk - [facility] E3/E4, 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 No   FALLS: Has patient fallen in last 6 months?  No  LIVING ENVIRONMENT: Lives with: lives with their spouse Lives in: House/apartment  PLOF:  Level of assistance: Needed assistance with ADLs, Needed assistance with IADLS Employment: Retired   PATIENT GOALS   to improve language and cognition  SUBJECTIVE STATEMENT: "I didn't bring in my book today" Pt accompanied by: significant other  OBJECTIVE:    TODAY'S TREATMENT:   Skilled treatment session focused on pt's cognitive communication goals. SLP facilitated session by providing the following intervention:  While pt didn't bring in his memory book, he and his wife good use and that it continues to be helpful  Pt with joking throughout the session and intermittent difficulty by this writer discerning concerns vs humor. With his wife's help, this writer better able to discern with recommendation for visual supports to aid in putting laundry in the correct drawers and for using the house thermostat      PATIENT EDUCATION: Education details: see above  Person educated: Patient and Spouse Education method: Explanation Education comprehension: needs further education  HOME EXERCISE PROGRAM:   Use memory book, put up visual aids   GOALS:  Goals reviewed with patient? Yes  SHORT TERM GOALS: Target date: 10 sessions  With Supervision A, patient will name common household objects at 90%  accuracy.          Baseline: Goal status: INITIAL  2.  With Supervision A, patient will name simple line drawings at 75% accuracy. Baseline:  Goal status: INITIAL   LONG TERM GOALS: Target date: 06/19/2023  With Min A, patient will demonstrate knowledge of appropriate activities to support cognitive and  language function outside of ST with assistance from family.   Baseline:  Goal status: INITIAL  2.  Patient will report engagement in cognitive activities outside of ST for 5/7 days.  Baseline:  Goal status: INITIAL  ASSESSMENT:  CLINICAL IMPRESSION: Patient is a 73 y.o. male who was seen today for a cognitive communication treatment d/t dx of moderate Mixed Alzheimer and Vascular Dementia. Pt with significant cognitive impairment with deficits in memory impacting orientation, recall of information (short-term and working memory), word finding and fluency of communication. He is reliant on his wife for medication management, driving, bill paying, organizing doctor appointments. Pt and his wife report decreased participation in activities, main activity is watching TV. Pt also observed with BUE tremors and whole body movements.   Pt and his wife eager to implement the above recommendations. See the above treatment note for details.    OBJECTIVE IMPAIRMENTS include attention, memory, executive functioning, and expressive language. These impairments are limiting patient from managing medications, managing appointments, managing finances, household responsibilities, ADLs/IADLs, and effectively communicating at home and in community. Factors affecting potential to achieve goals and functional outcome are ability to learn/carryover information, co-morbidities, medical prognosis, previous level of function, and severity of impairments. Patient will benefit from skilled SLP services to address above impairments and improve overall function.  REHAB POTENTIAL: Good  PLAN: SLP FREQUENCY:  1-2x/week  SLP DURATION: 8 weeks  PLANNED INTERVENTIONS: Environmental controls, Cueing hierachy, Cognitive reorganization, Internal/external aids, Functional tasks, SLP instruction and feedback, Compensatory strategies, and Patient/family education   Preeya Cleckley B. Dreama Saa, M.S., CCC-SLP, Tree surgeon Certified Brain Injury Specialist [facility]  [facility] Rehabilitation Services Office [phone] Ascom [phone] Fax [phone]

## 2023-05-08 ENCOUNTER — Ambulatory Visit: Payer: Medicare Other | Admitting: Speech Pathology

## 2023-05-08 DIAGNOSIS — R41841 Cognitive communication deficit: Secondary | ICD-10-CM

## 2023-05-08 NOTE — Therapy (Unsigned)
 OUTPATIENT SPEECH LANGUAGE PATHOLOGY  COGNITION TREATMENT SESSION   Patient Name: Alan Meyer MRN: [id number] DOB:August 16, 1950, 74 y.o., male Today's Date: 05/08/2023  PCP: Gardiner Coins, PA REFERRING PROVIDER: Cristopher Peru, MD   End of Session - 05/08/23 1016     Visit Number 5    Number of Visits 17    Date for SLP Re-Evaluation 06/19/23    Authorization Type [employer]    Progress Note Due on Visit 10    SLP Start Time 1015    SLP Stop Time  1100    SLP Time Calculation (min) 45 min    Activity Tolerance Patient tolerated treatment well             Past Medical History:  Diagnosis Date   Chest pain with high risk for cardiac etiology 01/26/2022   Chronic diarrhea    Chronic diarrhea of unknown origin 07/12/2021   Eosinophilic esophagitis 2019   Erectile dysfunction 03/12/2017   Essential hypertension 12/27/2021   Heart palpitations 01/26/2022   Non-seasonal allergic rhinitis 03/12/2017   Premature atrial contractions 02/28/2022   SOB (shortness of breath) on exertion 01/26/2022   Past Surgical History:  Procedure Laterality Date   APPENDECTOMY     FINGER SURGERY     HERNIA REPAIR     There are no active problems to display for this patient.   ONSET DATE: January 2023; date of initial referral 01/03/2023 (services not sought at that time); date of current referral 04/17/2023  REFERRING DIAG: G30.9,F01.50,F02.80 (ICD-10-CM) - Mixed Alzheimer and vascular dementia (HCC)  THERAPY DIAG:  Cognitive communication deficit  Rationale for Evaluation and Treatment Rehabilitation  SUBJECTIVE:   PERTINENT HISTORY and DIAGNOSTIC FINDINGS: Pt is a 73 year old male with chart review documenting "Mild to moderate mixed dementia (vascular dementia + Alzheimer's Disease vs frontotemporal dementia - personality changes, some apathy, social disinhibition, with some signs of Lewy Body Dementia - visual hallucinations, some paranoia), onset  January 2023, in patient with difficulty processing information, anxiety in unfamiliar situations, difficulty learning new information (learning how to use a cell phone), confusion with directions while driving, white matter microvascular ischemic and metabolic changes, generalized cerebral atrophy, signs of left basal ganglia region microhemorrhage.APO Alzheimer's Risk - [facility] E3/E4, 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 No   FALLS: Has patient fallen in last 6 months?  No  LIVING ENVIRONMENT: Lives with: lives with their spouse Lives in: House/apartment  PLOF:  Level of assistance: Needed assistance with ADLs, Needed assistance with IADLS Employment: Retired   PATIENT GOALS   to improve language and cognition  SUBJECTIVE STATEMENT: "I haven't written anything in because I thought we were done" Pt accompanied by: significant other  OBJECTIVE:    TODAY'S TREATMENT:   Skilled treatment session focused on pt's cognitive communication goals. SLP facilitated session by providing the following intervention:  Pt continues to display off topic comments about their marriage (being good) and joking possibly indicating decreased understanding of tasks/information. He brought in his memory book and reports that he felt he was "done with it" meaning done with performing the task as a ST homework activity vs continued use as a memory aid - suspect this isn't useful external aid d/t severity of pt's cognitive communication deficits.   Pt better target pt's functional daily activities, SLP facilitated motivational interviewing with pt's wife.   She reports that  pt is eager to be helpful but is unable to complete tasks - specifically he will put clothes in the washing machine and then "stare at me because he doesn't know what to do next." SLP instructed wife to create list of very  basic directions using only salient words - put clothes in, put Tide in, put Downy in, turn on - and cue pt to look at list of written instructions -   An other example provided consisted of pt holding pan in his hand with no attempt to open cabinets to problem solve where it went. She mentions that she told him 3 times "it goes in the bottom right" and he still was not able to put pan in the drawer d/t decreased understanding of information. Continue to recommend labeling drawers and cabinets as well as showing pt the location while providing the verbal description.  Pt commented "it hurt my feelings because I couldn't think."  His wife also mentioned "it is hard to get use too because he has always been so independent" and that she would like for him to be to "identify activities that need to be done like organizing his shoes" - would recommend that she initiate tasks "lets go organize your shoes"    Recommend continued ST sessions to problem solve daily situations  as well as patient's feeling during those moments such as when he was not able to use his credit card at the gas pump.    PATIENT EDUCATION: Education details: see above  Person educated: Patient and Spouse Education method: Explanation Education comprehension: needs further education   HOME EXERCISE PROGRAM:   Use memory book, put up visual aids   GOALS:  Goals reviewed with patient? Yes  SHORT TERM GOALS: Target date: 10 sessions  With Supervision A, patient will name common household objects at 90% accuracy.          Baseline: Goal status: INITIAL  2.  With Supervision A, patient will name simple line drawings at 75% accuracy. Baseline:  Goal status: INITIAL   LONG TERM GOALS: Target date: 06/19/2023  With Min A, patient will demonstrate knowledge of appropriate activities to support cognitive and  language function outside of ST with assistance from family.   Baseline:  Goal status: INITIAL  2.  Patient  will report engagement in cognitive activities outside of ST for 5/7 days.  Baseline:  Goal status: INITIAL  ASSESSMENT:  CLINICAL IMPRESSION: Patient is a 73 y.o. male who was seen today for a cognitive communication treatment d/t dx of moderate Mixed Alzheimer and Vascular Dementia. Pt with significant cognitive impairment with deficits in memory impacting orientation, recall of information (short-term and working memory), word finding and fluency of communication. He is reliant on his wife for medication management, driving, bill paying, organizing doctor appointments. Pt and his wife report decreased participation in activities, main activity is watching TV. Pt also observed with BUE tremors and whole body movements.   Pt and his wife eager to implement the above recommendations. See the above treatment note for details.    OBJECTIVE IMPAIRMENTS include attention, memory, executive functioning, and expressive language. These impairments are limiting patient from managing medications, managing appointments, managing finances, household responsibilities, ADLs/IADLs, and effectively communicating at home and in community. Factors affecting potential to achieve goals and functional outcome are ability to learn/carryover information, co-morbidities, medical prognosis, previous level of function, and severity of impairments. Patient will benefit from skilled SLP services to address above impairments and improve overall function.  REHAB POTENTIAL: Good  PLAN: SLP FREQUENCY: 1-2x/week  SLP DURATION: 8 weeks  PLANNED INTERVENTIONS: Environmental controls, Cueing hierachy, Cognitive reorganization, Internal/external aids, Functional tasks, SLP instruction and feedback, Compensatory strategies, and Patient/family education   Marlon Vonruden B. Dreama Saa, M.S., CCC-SLP, Tree surgeon Certified Brain Injury Specialist [facility]  [facility]  [facility] Office [phone] Ascom [phone] Fax [phone]

## 2023-05-13 ENCOUNTER — Ambulatory Visit: Payer: Medicare Other | Admitting: Speech Pathology

## 2023-05-16 ENCOUNTER — Ambulatory Visit: Payer: Medicare Other | Admitting: Speech Pathology

## 2023-05-20 ENCOUNTER — Encounter: Payer: Medicare Other | Admitting: Speech Pathology

## 2023-05-21 ENCOUNTER — Encounter: Admitting: Speech Pathology

## 2023-05-23 ENCOUNTER — Encounter: Payer: Medicare Other | Admitting: Speech Pathology

## 2023-05-27 ENCOUNTER — Encounter: Payer: Medicare Other | Admitting: Speech Pathology

## 2023-05-28 ENCOUNTER — Encounter: Payer: Medicare Other | Admitting: Speech Pathology

## 2023-05-30 ENCOUNTER — Encounter: Payer: Medicare Other | Admitting: Speech Pathology

## 2023-06-03 ENCOUNTER — Encounter: Payer: Medicare Other | Admitting: Speech Pathology

## 2023-06-06 ENCOUNTER — Encounter: Payer: Medicare Other | Admitting: Speech Pathology

## 2023-06-10 ENCOUNTER — Encounter: Payer: Medicare Other | Admitting: Speech Pathology

## 2023-06-12 ENCOUNTER — Encounter: Payer: Medicare Other | Admitting: Speech Pathology

## 2023-06-17 ENCOUNTER — Encounter: Payer: Medicare Other | Admitting: Speech Pathology

## 2023-06-19 ENCOUNTER — Encounter: Payer: Medicare Other | Admitting: Speech Pathology

## 2023-06-24 ENCOUNTER — Encounter: Payer: Medicare Other | Admitting: Speech Pathology

## 2023-06-26 ENCOUNTER — Encounter: Payer: Medicare Other | Admitting: Speech Pathology

## 2023-07-01 ENCOUNTER — Encounter: Payer: Medicare Other | Admitting: Speech Pathology

## 2023-07-03 ENCOUNTER — Encounter: Payer: Medicare Other | Admitting: Speech Pathology

## 2023-07-08 ENCOUNTER — Encounter: Payer: Medicare Other | Admitting: Speech Pathology

## 2023-07-10 ENCOUNTER — Encounter: Payer: Medicare Other | Admitting: Speech Pathology

## 2023-07-15 ENCOUNTER — Encounter: Payer: Medicare Other | Admitting: Speech Pathology

## 2023-07-17 ENCOUNTER — Encounter: Payer: Medicare Other | Admitting: Speech Pathology

## 2023-07-24 ENCOUNTER — Encounter: Payer: Medicare Other | Admitting: Speech Pathology

## 2023-07-26 ENCOUNTER — Encounter: Payer: Medicare Other | Admitting: Speech Pathology

## 2023-07-29 ENCOUNTER — Encounter: Payer: Medicare Other | Admitting: Speech Pathology

## 2023-07-31 ENCOUNTER — Encounter: Payer: Medicare Other | Admitting: Speech Pathology

## 2023-08-05 ENCOUNTER — Encounter: Payer: Medicare Other | Admitting: Speech Pathology

## 2023-08-07 ENCOUNTER — Encounter: Payer: Medicare Other | Admitting: Speech Pathology

## 2024-03-31 ENCOUNTER — Other Ambulatory Visit: Payer: Self-pay | Admitting: Physician Assistant

## 2024-03-31 DIAGNOSIS — K200 Eosinophilic esophagitis: Secondary | ICD-10-CM
